# Patient Record
Sex: Male | Born: 1970 | Race: White | Hispanic: No | Marital: Married | Smoking: Former smoker
Health system: Southern US, Community
[De-identification: ages and names within clinical notes are randomized; demographics above are authoritative.]

## PROBLEM LIST (undated history)

## (undated) DIAGNOSIS — I10 Essential (primary) hypertension: Secondary | ICD-10-CM

## (undated) DIAGNOSIS — E7800 Pure hypercholesterolemia, unspecified: Secondary | ICD-10-CM

## (undated) DIAGNOSIS — E079 Disorder of thyroid, unspecified: Secondary | ICD-10-CM

## (undated) DIAGNOSIS — E119 Type 2 diabetes mellitus without complications: Secondary | ICD-10-CM

## (undated) HISTORY — PX: SHOULDER SURGERY: SHX246

## (undated) HISTORY — PX: FRACTURE SURGERY: SHX138

## (undated) HISTORY — PX: ANKLE SURGERY: SHX546

## (undated) HISTORY — PX: ELBOW SURGERY: SHX618

---

## 2004-12-25 ENCOUNTER — Inpatient Hospital Stay: Payer: Self-pay | Admitting: Unknown Physician Specialty

## 2006-03-29 ENCOUNTER — Ambulatory Visit: Payer: Self-pay | Admitting: Family Medicine

## 2006-08-04 ENCOUNTER — Ambulatory Visit: Payer: Self-pay | Admitting: Podiatry

## 2006-08-18 ENCOUNTER — Ambulatory Visit: Payer: Self-pay | Admitting: Podiatry

## 2006-08-31 ENCOUNTER — Ambulatory Visit: Payer: Self-pay | Admitting: Podiatry

## 2006-10-27 ENCOUNTER — Encounter: Payer: Self-pay | Admitting: Podiatry

## 2006-11-21 ENCOUNTER — Encounter: Payer: Self-pay | Admitting: Podiatry

## 2006-12-21 ENCOUNTER — Encounter: Payer: Self-pay | Admitting: Podiatry

## 2007-05-15 ENCOUNTER — Ambulatory Visit: Payer: Self-pay | Admitting: Family Medicine

## 2012-02-26 ENCOUNTER — Ambulatory Visit: Payer: Self-pay | Admitting: Medical

## 2012-03-03 ENCOUNTER — Ambulatory Visit: Payer: Self-pay | Admitting: Internal Medicine

## 2012-06-01 ENCOUNTER — Ambulatory Visit: Payer: Self-pay | Admitting: Unknown Physician Specialty

## 2013-11-08 ENCOUNTER — Ambulatory Visit: Payer: Self-pay | Admitting: Family Medicine

## 2013-11-22 ENCOUNTER — Ambulatory Visit: Payer: Self-pay | Admitting: Unknown Physician Specialty

## 2013-12-31 ENCOUNTER — Ambulatory Visit: Payer: Self-pay | Admitting: Unknown Physician Specialty

## 2013-12-31 LAB — HEMOGLOBIN: HGB: 17.4 g/dL (ref 13.0–18.0)

## 2013-12-31 LAB — BASIC METABOLIC PANEL
Anion Gap: 5 — ABNORMAL LOW (ref 7–16)
BUN: 8 mg/dL (ref 7–18)
CHLORIDE: 100 mmol/L (ref 98–107)
Calcium, Total: 9.1 mg/dL (ref 8.5–10.1)
Co2: 33 mmol/L — ABNORMAL HIGH (ref 21–32)
Creatinine: 0.99 mg/dL (ref 0.60–1.30)
EGFR (Non-African Amer.): >60
Glucose: 114 mg/dL — ABNORMAL HIGH (ref 65–99)
Osmolality: 275 (ref 275–301)
POTASSIUM: 3.5 mmol/L (ref 3.5–5.1)
Sodium: 138 mmol/L (ref 136–145)

## 2014-01-04 ENCOUNTER — Ambulatory Visit: Payer: Self-pay | Admitting: Unknown Physician Specialty

## 2014-07-13 NOTE — Op Note (Signed)
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   PROCEDURE: Arthroscopic subacromial decompression along with arthroscopic release of long head of the biceps tendon followed by arthroscopic acromioclavicular joint resection and microfracture of intra-articular chondral lesion.   SURGEON: Gabrielle Dare., M.D.   ANESTHESIA: General plus interscalene block on the right shoulder.   HISTORY: The patient had a long history of right shoulder pain. He had had his right subacromial space injected with steroid and anesthetic, but this did not relieve his symptoms. MRI of the shoulder revealed some degenerative changes with rotator cuff tendinosis and possible cuff tear.   The patient was ultimately brought in for surgery because of his persistent symptoms.   DESCRIPTION OF PROCEDURE: The patient had an interscalene block on his right shoulder in the preoperative area and then was taken to the operating room where satisfactory general anesthesia was achieved. The patient was turned to the lateral decubitus position with the right shoulder up. The right shoulder was prepped and draped in the usual fashion for an arthroscopic procedure. The patient incidentally was given 2 grams of Kefzol IV prior to the start of the procedure.   A scope was introduced through a posterior puncture wound into the glenohumeral joint. The joint was distended with lactated Ringers. Inspection of the joint revealed the patient had a small grade 4 chondral lesion on his humeral head and in the posteroinferior aspect of his glenoid. He had tremendous fraying of  his labrum and some fraying of his long head of the biceps tendon. There was a little fraying of the intra-articular side of the cuff near the supraspinatus attachment, but no significant tear was appreciated.   I went ahead and established an anterior portal and released the long head of the biceps tendon from its labral attachment. I used an angled ArthroCare wand for this. I then performed a limited synovectomy in the glenohumeral joint using the ArthroCare wand and a motorized resector. I also debrided the frayed labrum with a motorized resector and ArthroCare wand. I then went ahead and abraded the chondral lesions with a large round bur and then micro-fractured the lesions with a 45 degree awl.   I then switched the scope to the subacromial space. A lateral portal was established. I introduced a shaver and ArthroCare wand to perform a bursectomy. No obvious rotator cuff tear was appreciated. The patient did have some spurring on the inferior aspect of his anterior acromion. With the scope in the lateral portal I introduced an acromionizer burr through the posterior portal and performed a subacromial decompression. I switched the scope back to the posterior portal and resected the distal clavicle using the lateral portal and a portal over the anterior aspect of the [facility] joint. Further inspection of the shoulder failed to reveal any obvious cuff tear.   The instruments were removed from the shoulder joint. There were 4 puncture wounds, which I closed with 3-0 nylon in vertical mattress fashion. Betadine was applied followed by a sterile dressing and a sling.   The patient was awakened and transferred to his stretcher bed and awakened and taken to the recovery room in satisfactory condition. Blood loss was negligible. ____________________________  Alda BertholdHarold B. Jadamarie Butson Jr., MD hbk:sb D: 01/04/2014 16:09:35 ET T: 01/04/2014 17:11:23 ET JOB#: [id number]  cc: Alda BertholdHarold B. Jeron Grahn Jr., MD, <Dictator> Randon GoldsmithHAROLD B  Ilhan Madan, Montez HagemanJR MD ELECTRONICALLY SIGNED 01/13/2014 15:30

## 2016-07-05 IMAGING — MR MRI OF THE RIGHT SHOULDER WITHOUT CONTRAST
5 series · 40 of 40 positions shown · non-contrast
Comparison: 06/01/2012

CLINICAL DATA: Right shoulder pain for 5-6 years. Sports: Baseball.
Reduced range of motion.

EXAM:
MRI OF THE RIGHT SHOULDER WITHOUT CONTRAST
TECHNIQUE: Multiplanar, multisequence MR imaging of the shoulder was performed.
No intravenous contrast was administered.

[Series 4: T2 fat-sat · axial · 4.0mm · 0.62mm/px · z∈[-28,+60]mm · 8 of 21 slices shown (1 of 2)]
[im 1/21]
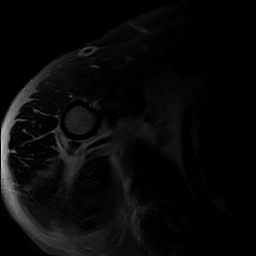
[im 3/21]
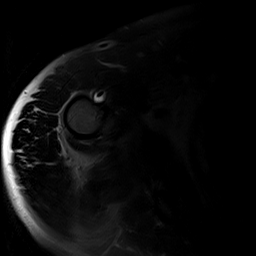
[im 6/21]
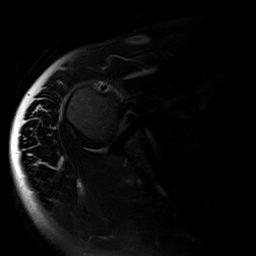
[im 9/21]
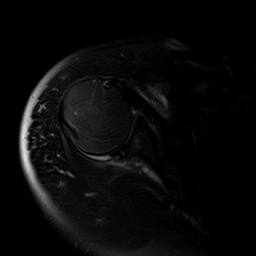
[im 12/21]
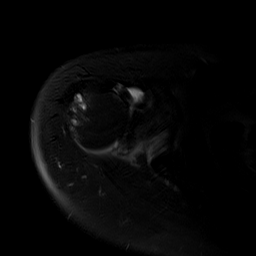
[im 15/21]
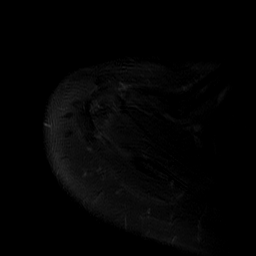
[im 18/21]
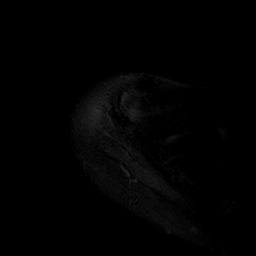
[im 21/21]
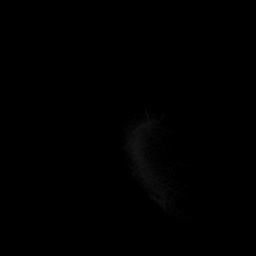

[Series 6: T2 · oblique · 4.0mm · 0.62mm/px · 8 of 19 slices shown]
[im 1/19]
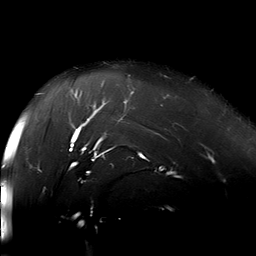
[im 3/19]
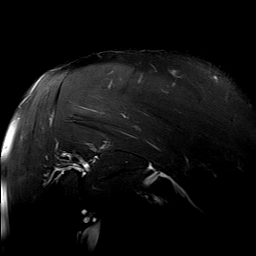
[im 6/19]
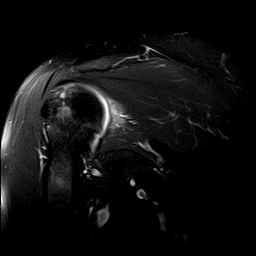
[im 8/19]
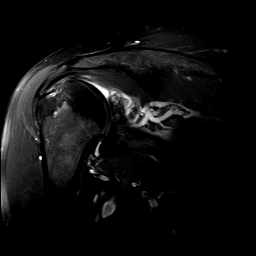
[im 11/19]
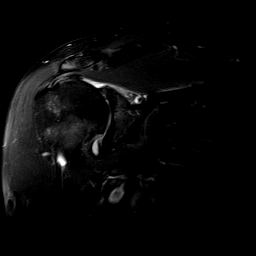
[im 13/19]
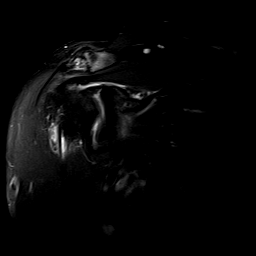
[im 16/19]
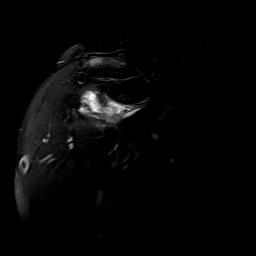
[im 19/19]
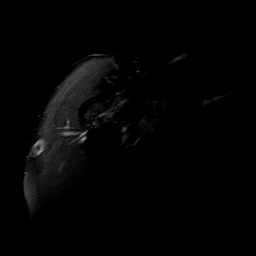

[Series 8: coronal obl pd_fil_1 · oblique · 4.0mm · 0.62mm/px · 8 of 19 slices shown]
[im 1/19]
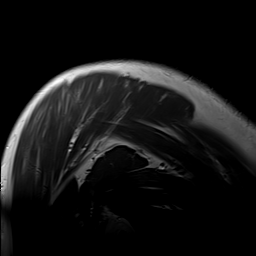
[im 3/19]
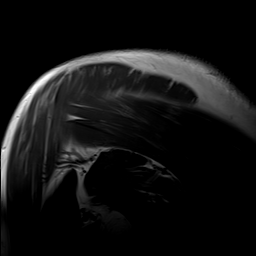
[im 6/19]
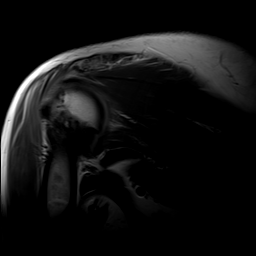
[im 8/19]
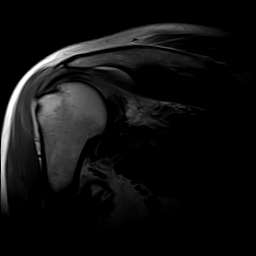
[im 11/19]
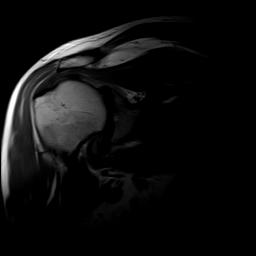
[im 13/19]
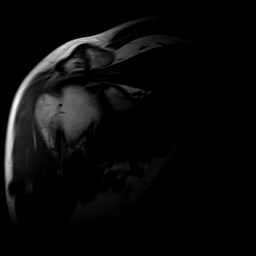
[im 16/19]
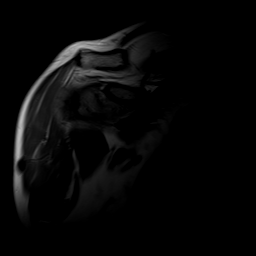
[im 19/19]
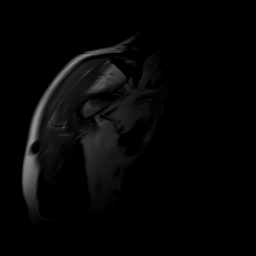

[Series 9: T1 · sagittal · 4.0mm · 0.62mm/px · 8 of 21 slices shown]
[im 1/21]
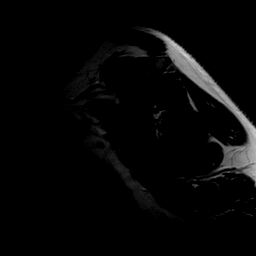
[im 3/21]
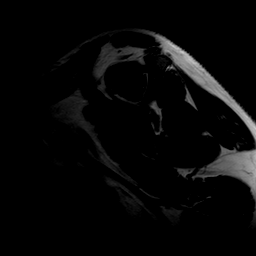
[im 6/21]
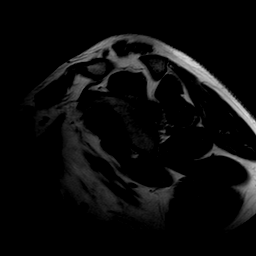
[im 9/21]
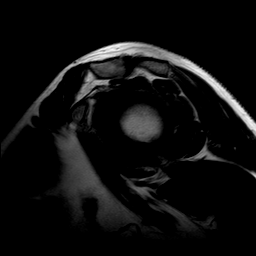
[im 12/21]
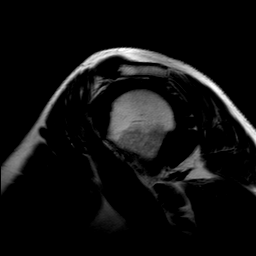
[im 15/21]
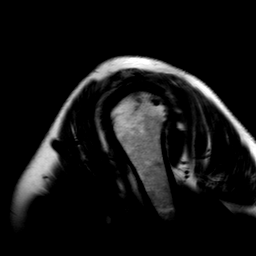
[im 18/21]
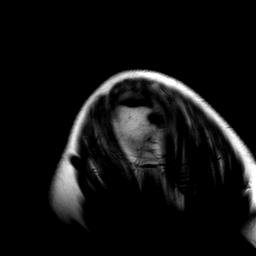
[im 21/21]
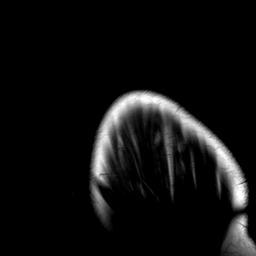

[Series 12: T2 fat-sat · sagittal · 4.0mm · 0.62mm/px · 8 of 21 slices shown (2 of 2)]
[im 1/21]
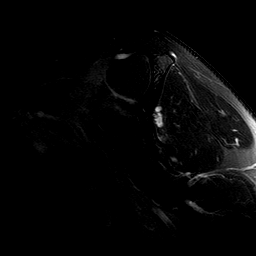
[im 3/21]
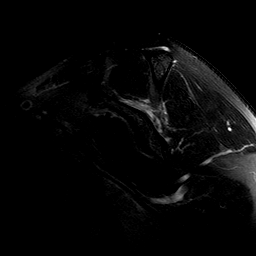
[im 6/21]
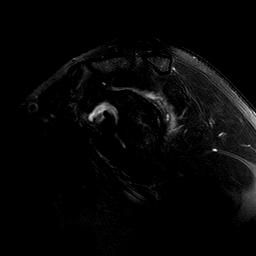
[im 9/21]
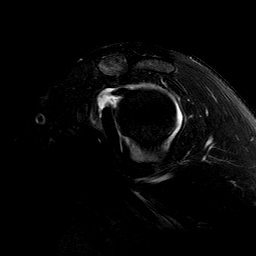
[im 12/21]
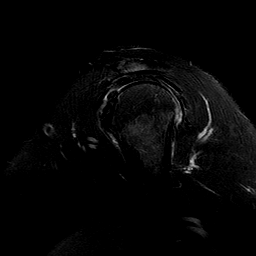
[im 15/21]
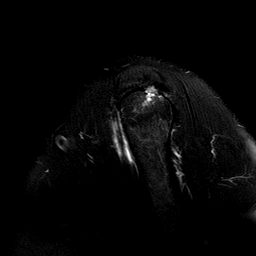
[im 18/21]
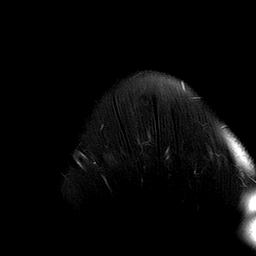
[im 21/21]
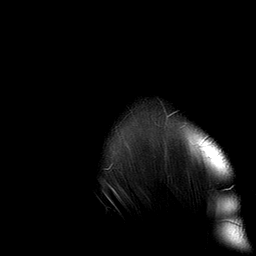

[40 of 40 positions shown; findings below may reference images not displayed]

FINDINGS: Despite efforts by the technologist and patient, motion artifact is
present on today's exam and could not be eliminated. This reduces
exam sensitivity and specificity.

Rotator cuff: Considerable distal partial thickness articular
surface tearing of the infraspinatus and supraspinatus tendons
observed, images 6 through 11 of series 6. No definite
full-thickness rotator cuff tear identified. There is associated
mild supraspinatus and infraspinatus tendinopathy and minimal
subscapularis tendinopathy.

Muscles:  Intact

Biceps long head:  Mild tendinopathy of the intra-articular segment.

Acromioclavicular Joint: Mild to moderate degenerative arthropathy
with subcortical edema and mild inferior spurring. Subacromial
morphology is type 2 (curved).

Glenohumeral Joint: Mild shoulder joint effusion. Considerable
thinning of articular cartilage along the humerus and especially the
glenoid, with considerable degenerative subcortical edema and some
flattening of the posterior glenoid which appears degenerative. Mild
glenoid and humeral head spurring.

Labrum:  Grossly intact

Bones: Degenerative subcortical cystic lesions noted in the
posterior in central portions of the anatomic neck laterally.
IMPRESSION: 1. Partial thickness articular surface tearing along significant
portions of the distal infraspinatus and supraspinatus tendons,
without full-thickness tear identified. No significant abnormal
fluid in the subacromial subdeltoid bursa.
2. Mild supraspinatus scratch that mild rotator cuff and biceps
tendinopathy.
3. Small shoulder joint effusion.
4. Degenerative glenohumeral arthropathy is most severe posteriorly
on the glenoid with air some flattening and a confluence of small
degenerative subcortical lesions. Mild humeral head and glenoid
spurring.
5. Mild to moderate degenerative AC joint arthropathy.

## 2019-05-18 ENCOUNTER — Ambulatory Visit (INDEPENDENT_AMBULATORY_CARE_PROVIDER_SITE_OTHER): Payer: BC Managed Care – PPO

## 2019-05-18 ENCOUNTER — Ambulatory Visit
Admission: EM | Admit: 2019-05-18 | Discharge: 2019-05-18 | Disposition: A | Discharge status: Home / Self Care | Payer: BC Managed Care – PPO | Attending: Family Medicine | Admitting: Family Medicine

## 2019-05-18 ENCOUNTER — Other Ambulatory Visit: Payer: Self-pay

## 2019-05-18 ENCOUNTER — Encounter: Payer: Self-pay | Admitting: Emergency Medicine

## 2019-05-18 DIAGNOSIS — R000 Tachycardia, unspecified: Secondary | ICD-10-CM | POA: Diagnosis present

## 2019-05-18 DIAGNOSIS — M25572 Pain in left ankle and joints of left foot: Secondary | ICD-10-CM | POA: Diagnosis not present

## 2019-05-18 DIAGNOSIS — I959 Hypotension, unspecified: Secondary | ICD-10-CM | POA: Diagnosis present

## 2019-05-18 DIAGNOSIS — S93402A Sprain of unspecified ligament of left ankle, initial encounter: Secondary | ICD-10-CM | POA: Insufficient documentation

## 2019-05-18 DIAGNOSIS — S86002A Unspecified injury of left Achilles tendon, initial encounter: Secondary | ICD-10-CM | POA: Diagnosis present

## 2019-05-18 DIAGNOSIS — E876 Hypokalemia: Secondary | ICD-10-CM | POA: Diagnosis present

## 2019-05-18 DIAGNOSIS — M79672 Pain in left foot: Secondary | ICD-10-CM

## 2019-05-18 DIAGNOSIS — I9589 Other hypotension: Secondary | ICD-10-CM

## 2019-05-18 HISTORY — DX: Type 2 diabetes mellitus without complications: E11.9

## 2019-05-18 HISTORY — DX: Pure hypercholesterolemia, unspecified: E78.00

## 2019-05-18 HISTORY — DX: Essential (primary) hypertension: I10

## 2019-05-18 HISTORY — DX: Disorder of thyroid, unspecified: E07.9

## 2019-05-18 LAB — BASIC METABOLIC PANEL
Anion gap: UNDETERMINED (ref 5–15)
BUN: 23 mg/dL — ABNORMAL HIGH (ref 6–20)
CO2: 21 mmol/L — ABNORMAL LOW (ref 22–32)
Calcium: 8.9 mg/dL (ref 8.9–10.3)
Chloride: 84 mmol/L — ABNORMAL LOW (ref 98–111)
Creatinine, Ser: 1.38 mg/dL — ABNORMAL HIGH (ref 0.61–1.24)
GFR calc Af Amer: >60 mL/min (ref >60)
GFR calc non Af Amer: 59 mL/min — ABNORMAL LOW (ref >60)
Glucose, Bld: 159 mg/dL — ABNORMAL HIGH (ref 70–99)
Potassium: 3.3 mmol/L — ABNORMAL LOW (ref 3.5–5.1)
Sodium: UNDETERMINED mmol/L (ref 135–145)

## 2019-05-18 MED ORDER — SODIUM CHLORIDE 0.9 % IV BOLUS
1000.0000 mL | Freq: Once | INTRAVENOUS | Status: AC
  Administered 2019-05-18: 1000 mL via INTRAVENOUS

## 2019-05-18 MED ORDER — KETOROLAC TROMETHAMINE 60 MG/2ML IM SOLN
60.0000 mg | Freq: Once | INTRAMUSCULAR | Status: AC
  Administered 2019-05-18: 60 mg via INTRAMUSCULAR

## 2019-05-18 MED ORDER — POTASSIUM CHLORIDE CRYS ER 20 MEQ PO TBCR
20.0000 meq | EXTENDED_RELEASE_TABLET | Freq: Once | ORAL | Status: AC
  Administered 2019-05-18: 20 meq via ORAL

## 2019-05-18 MED ORDER — NAPROXEN 500 MG PO TABS: 500.0000 mg | ORAL_TABLET | Freq: Two times a day (BID) | ORAL | 0 refills | Status: AC

## 2019-05-18 NOTE — ED Provider Notes (Signed)
[facility]    CSN: [id number] Arrival date & time: 05/18/19  1549      History   Chief Complaint Chief Complaint  Patient presents with  . Ankle Pain    left  . Foot Pain    left    HPI Dakota Nunez is a 49 y.o. male.   Dakota Nunez presents with complaints of left ankle and foot pain. Yesterday evening was helping push his daughters jeep up a hill and he slipped/ mistepped with his left foot and it twisted and he fell. Heard a pop. Ambulatory after this but has since developed pain which has worsened. Numbness sensation to lower leg. No bruising or swelling. No redness. Took tylenol which didn't necessarily help. No previous injury to left foot or ankle. Pain 9/10.    He does take lisinopril-hctz for his htn. History of alcohol abuse, states he has cut way back and instead of drinking daily he has been drinking maybe only twice a week. Difficulty obtaining BP initially, and with exam patient states he feels lightheaded due to pain. No further light headedness, chest pain , or palpitations. He does follow with a PCP, last seen last June.     ROS per HPI, negative if not otherwise mentioned.      Past Medical History:  Diagnosis Date  . Diabetes mellitus without complication ([place])   . Hypercholesterolemia   . Hypertension   . Thyroid disease     There are no problems to display for this patient.   Past Surgical History:  Procedure Laterality Date  . ANKLE SURGERY Right   . ELBOW SURGERY Left   . FRACTURE SURGERY    . SHOULDER SURGERY Right        Home Medications    Prior to Admission medications   Medication Sig Start Date End Date Taking? Authorizing Provider  atorvastatin (LIPITOR) 10 MG tablet Take by mouth. 05/25/17  Yes [provider]  budesonide-formoterol (SYMBICORT) 80-4.5 MCG/ACT inhaler Inhale into the lungs. 05/25/17  Yes [provider]  lisinopril (ZESTRIL) 20 MG tablet Take by mouth. 05/28/18  Yes [provider]  magnesium oxide (MAG-OX) 400 MG tablet Take by mouth. 05/28/18 05/28/19 Yes [provider]  metFORMIN (GLUCOPHAGE) 850 MG tablet Take by mouth. 05/25/17  Yes [provider]  Multiple Vitamins-Minerals (MULTIVITAMIN WITH MINERALS) tablet Take by mouth. 05/28/18 05/28/19 Yes [provider]  Omega-3 Fatty Acids (FISH OIL) 1000 MG CAPS Take by mouth.   Yes [provider]  omeprazole (PRILOSEC) 40 MG capsule Take by mouth. 11/28/17  Yes [provider]  naproxen (NAPROSYN) 500 MG tablet Take 1 tablet (500 mg total) by mouth 2 (two) times daily. 05/18/19   Zigmund Gottron, NP    Family History Family History  Problem Relation Age of Onset  . Cancer Mother   . Emphysema Father     Social History Social History   Tobacco Use  . Smoking status: Former Research scientist (life sciences)  . Smokeless tobacco: Never Used  Substance Use Topics  . Alcohol use: Yes  . Drug use: Never     Allergies   Patient has no known allergies.   Review of Systems Review of Systems   Physical Exam Triage Vital Signs ED Triage Vitals  Enc Vitals Group     BP 05/18/19 1614 92/62     Pulse Rate 05/18/19 1614 (!) 126     Resp 05/18/19 1614 16     Temp 05/18/19  1614 98.3 F (36.8 C)     Temp Source 05/18/19 1614 Oral     SpO2 05/18/19 1614 99 %     Weight 05/18/19 1608 140 lb (63.5 kg)     Height 05/18/19 1608 5\' 7"  (1.702 m)     Head Circumference --      Peak Flow --      Pain Score 05/18/19 1608 9     Pain Loc --      Pain Edu? --      Excl. in GC? --    No data found.  Updated Vital Signs BP 116/70 (BP Location: Right Arm)   Pulse 100   Temp 98.3 F (36.8 C) (Oral)   Resp 16   Ht 5\' 7"  (1.702 m)   Wt 140 lb (63.5 kg)   SpO2 95%   BMI 21.93 kg/m    Physical Exam Constitutional:      Appearance: He is well-developed.  Cardiovascular:     Rate and Rhythm: Tachycardia present.  Pulmonary:     Effort: Pulmonary effort is normal.  Musculoskeletal:      Left ankle: No swelling, deformity, ecchymosis or lacerations. Tenderness present. Decreased range of motion.     Left Achilles Tendon: Tenderness present. Thompson's test negative.     Comments: Patient with significant tenderness about ankle without specific bony tenderness; strong palpation pulse; cap refill < 2 seconds ; unable to dorsiflex ankle; moving toes without difficulty; no knee or proximal tibia pain; states decreased sensation but sensation intact; foot is warm; pain with thompson's test and some weakness noted but still with response present  Skin:    General: Skin is warm and dry.  Neurological:     Mental Status: He is alert and oriented to person, place, and time.     EKG:  Sinus tach 117 . Previous EKG was not available for review. No stwave changes as interpreted by me.    UC Treatments / Results  Labs (all labs ordered are listed, but only abnormal results are displayed) Labs Reviewed  BASIC METABOLIC PANEL - Abnormal; Notable for the following components:      Result Value   Potassium 3.3 (*)    Chloride 84 (*)    CO2 21 (*)    Glucose, Bld 159 (*)    BUN 23 (*)    Creatinine, Ser 1.38 (*)    GFR calc non Af Amer 59 (*)    All other components within normal limits    EKG   Radiology DG Ankle Complete Left  Result Date: 05/18/2019 CLINICAL DATA:  Twisting injury, pain EXAM: LEFT ANKLE COMPLETE - 3+ VIEW; LEFT FOOT - COMPLETE 3+ VIEW COMPARISON:  None. FINDINGS: No fracture or dislocation of the left foot or ankle. Joint spaces are well preserved. Soft tissues are unremarkable. IMPRESSION: No fracture or dislocation of the left foot or ankle. Joint spaces are well preserved. Electronically Signed   By: M.D.   On: 05/18/2019 16:47   DG Foot Complete Left  Result Date: 05/18/2019 CLINICAL DATA:  Twisting injury, pain EXAM: LEFT ANKLE COMPLETE - 3+ VIEW; LEFT FOOT - COMPLETE 3+ VIEW COMPARISON:  None. FINDINGS: No fracture or dislocation of the  left foot or ankle. Joint spaces are well preserved. Soft tissues are unremarkable. IMPRESSION: No fracture or dislocation of the left foot or ankle. Joint spaces are well preserved. Electronically Signed   By: 05/20/2019 M.D.   On: 05/18/2019 16:47  Procedures Procedures (including critical care time)  Medications Ordered in UC Medications  ketorolac (TORADOL) injection 60 mg (60 mg Intramuscular Given 05/18/19 1716)  sodium chloride 0.9 % bolus 1,000 mL (0 mLs Intravenous Stopped 05/18/19 1826)  potassium chloride SA (KLOR-CON) CR tablet 20 mEq (20 mEq Oral Given 05/18/19 1846)    Initial Impression / Assessment and Plan / UC Course  I have reviewed the triage vital signs and the nursing notes.  Pertinent labs & imaging results that were available during my care of the patient were reviewed by me and considered in my medical decision making (see chart for details).     xrays normal. Patient with significant pain to ankle and achilles with limited rom of ankle. No bruising or swelling noted, however. Pain improved with toradol here in clinic. Cam walker and ace wrap applied. Follow up with orthopedics for recheck.   Patient initially hypotensive, unable to obtain an auto BP initially with HR up into the 130's and 140's, manual BP in the 90's. Down to the 70's. Opted to initiate liter of IVF with noted changes in labs to iclude elevated creatine and decreased bun and gfr. Unable to obtain sodium. Mild hyponatremia, K oral provided here before depart. Encouraged to stop his BP m edication and close follow up next week for bp and lab recheck with pcp. Return precautions provided. Patient verbalized understanding and agreeable to plan.   Final Clinical Impressions(s) / UC Diagnoses   Final diagnoses:  Achilles tendon injury, left, initial encounter  Sprain of left ankle, unspecified ligament, initial encounter  Hypotension, unspecified hypotension type  Tachycardia  Hypokalemia      Discharge Instructions     Your xrays are normal tonight which is reassuring. Your exam is consistent with ankle sprain and concern for some injury to your achilles as well. Use of the boot we have provided as well as crutches as needed. Ice, elevation to help with pain. Naproxen twice a day, may take additional tylenol as needed as well, but don't take any further advil or ibuprofen.  Please call on Monday to set up with your orthopedist for recheck in the next few weeks.  I would like you to hold off on your bp medication. Please call on Monday to follow up with your PCP for recheck of your BP and your labs in the next week.  If feeling dizzy, headache, light headed, palpitations, or otherwise worsening please go to the ER.     ED Prescriptions    Medication Sig Dispense Auth. Provider   naproxen (NAPROSYN) 500 MG tablet Take 1 tablet (500 mg total) by mouth 2 (two) times daily. 30 tablet Georgetta Haber, NP     PDMP not reviewed this encounter.   Georgetta Haber, NP 05/18/19 [phone]

## 2019-05-18 NOTE — ED Triage Notes (Signed)
Patient states that he was pushing his daughter's jeep up a hill and he states that he slipped on some leaves yesterday.  Patient states that his left ankle rolled up under his left leg.  Patient c/o pain and swelling in his left ankle and foot.  Patient states that he took Tylenol around 12pm today.

## 2019-05-18 NOTE — Discharge Instructions (Addendum)
Your xrays are normal tonight which is reassuring. Your exam is consistent with ankle sprain and concern for some injury to your achilles as well. Use of the boot we have provided as well as crutches as needed. Ice, elevation to help with pain. Naproxen twice a day, may take additional tylenol as needed as well, but don't take any further advil or ibuprofen.  Please call on Monday to set up with your orthopedist for recheck in the next few weeks.  I would like you to hold off on your bp medication. Please call on Monday to follow up with your PCP for recheck of your BP and your labs in the next week.  If feeling dizzy, headache, light headed, palpitations, or otherwise worsening please go to the ER.

## 2019-05-30 ENCOUNTER — Other Ambulatory Visit: Payer: Self-pay | Admitting: Family Medicine

## 2019-05-30 DIAGNOSIS — R748 Abnormal levels of other serum enzymes: Secondary | ICD-10-CM

## 2019-06-04 ENCOUNTER — Ambulatory Visit: Payer: BC Managed Care – PPO

## 2019-06-11 ENCOUNTER — Ambulatory Visit: Admission: RE | Admit: 2019-06-11 | Payer: BC Managed Care – PPO | Source: Ambulatory Visit

## 2019-06-18 ENCOUNTER — Ambulatory Visit
Admission: RE | Admit: 2019-06-18 | Discharge: 2019-06-18 | Disposition: A | Discharge status: Home / Self Care | Payer: BC Managed Care – PPO | Source: Ambulatory Visit | Attending: Family Medicine | Admitting: Family Medicine

## 2019-06-18 ENCOUNTER — Other Ambulatory Visit: Payer: Self-pay

## 2019-06-18 DIAGNOSIS — R748 Abnormal levels of other serum enzymes: Secondary | ICD-10-CM | POA: Diagnosis not present

## 2019-12-11 ENCOUNTER — Ambulatory Visit (INDEPENDENT_AMBULATORY_CARE_PROVIDER_SITE_OTHER): Payer: BC Managed Care – PPO

## 2019-12-11 ENCOUNTER — Other Ambulatory Visit: Payer: Self-pay

## 2019-12-11 ENCOUNTER — Ambulatory Visit
Admission: EM | Admit: 2019-12-11 | Discharge: 2019-12-11 | Disposition: A | Discharge status: Home / Self Care | Payer: BC Managed Care – PPO | Attending: Emergency Medicine | Admitting: Emergency Medicine

## 2019-12-11 DIAGNOSIS — S298XXA Other specified injuries of thorax, initial encounter: Secondary | ICD-10-CM | POA: Diagnosis not present

## 2019-12-11 DIAGNOSIS — S46911A Strain of unspecified muscle, fascia and tendon at shoulder and upper arm level, right arm, initial encounter: Secondary | ICD-10-CM

## 2019-12-11 DIAGNOSIS — M25511 Pain in right shoulder: Secondary | ICD-10-CM | POA: Diagnosis not present

## 2019-12-11 MED ORDER — ALBUTEROL SULFATE HFA 108 (90 BASE) MCG/ACT IN AERS
2.0000 | INHALATION_SPRAY | RESPIRATORY_TRACT | Status: DC | PRN
  Administered 2019-12-11 (×2): 2 via RESPIRATORY_TRACT

## 2019-12-11 NOTE — ED Provider Notes (Signed)
[facility]    CSN: [id number] Arrival date & time: 12/11/19  1602      History   Chief Complaint Chief Complaint  Patient presents with   Shoulder Pain    right    HPI Dakota Nunez is a 49 y.o. male.   49 yo male here for evaluation of right shoulder pain.  He reports that he was reaching up to pul down an overhead bun full of cable at work last night and felt a pop in his right shoulder.  He has had rotator cuff surgery on that shoulder 7-8 years ago.  HE says that he felt a second pop when he tried to raise his arm up over his head. Denies numbness, tingling, or weakness.      Past Medical History:  Diagnosis Date   Diabetes mellitus without complication (HCC)    Hypercholesterolemia    Hypertension    Thyroid disease     There are no problems to display for this patient.   Past Surgical History:  Procedure Laterality Date   ANKLE SURGERY Right    ELBOW SURGERY Left    FRACTURE SURGERY     SHOULDER SURGERY Right        Home Medications    Prior to Admission medications   Medication Sig Start Date End Date Taking? Authorizing Provider  atorvastatin (LIPITOR) 10 MG tablet Take by mouth. 05/25/17  Yes [provider]  budesonide-formoterol (SYMBICORT) 80-4.5 MCG/ACT inhaler Inhale into the lungs. 05/25/17  Yes [provider]  lisinopril (ZESTRIL) 20 MG tablet Take by mouth. 05/28/18  Yes [provider]  metFORMIN (GLUCOPHAGE) 850 MG tablet Take by mouth. 05/25/17  Yes [provider]  naproxen (NAPROSYN) 500 MG tablet Take 1 tablet (500 mg total) by mouth 2 (two) times daily. 05/18/19  Yes Burky, Barron Alvine, NP  Omega-3 Fatty Acids (FISH OIL) 1000 MG CAPS Take by mouth.   Yes [provider]  omeprazole (PRILOSEC) 40 MG capsule Take by mouth. 11/28/17  Yes [provider]    Family History Family History  Problem Relation Age of Onset   Cancer Mother    Emphysema Father      Social History Social History   Tobacco Use   Smoking status: Former Smoker   Smokeless tobacco: Never Used  Building services engineer Use: Never used  Substance Use Topics   Alcohol use: Yes   Drug use: Never     Allergies   Patient has no known allergies.   Review of Systems Review of Systems  Constitutional: Negative for activity change, appetite change and fever.  HENT: Negative for congestion and rhinorrhea.   Respiratory: Negative for cough, shortness of breath and wheezing.   Cardiovascular: Negative for chest pain.  Musculoskeletal: Positive for arthralgias. Negative for back pain, myalgias, neck pain and neck stiffness.  Skin: Negative.   Neurological: Negative for weakness and numbness.  Hematological: Negative.   Psychiatric/Behavioral: Negative.      Physical Exam Triage Vital Signs ED Triage Vitals  Enc Vitals Group     BP 12/11/19 1636 (!) 155/102     Pulse Rate 12/11/19 1636 (!) 115     Resp 12/11/19 1636 18     Temp 12/11/19 1636 98.2 F (36.8 C)     Temp Source 12/11/19 1636 Oral     SpO2 12/11/19 1636 90 %     Weight 12/11/19 1634 138 lb (62.6 kg)     Height 12/11/19  1634 5\' 6"  (1.676 m)     Head Circumference --      Peak Flow --      Pain Score 12/11/19 1634 8     Pain Loc --      Pain Edu? --      Excl. in GC? --    No data found.  Updated Vital Signs BP (!) 155/102 (BP Location: Left Arm)    Pulse (!) 115    Temp 98.2 F (36.8 C) (Oral)    Resp 18    Ht 5\' 6"  (1.676 m)    Wt 138 lb (62.6 kg)    SpO2 90%    BMI 22.27 kg/m   Visual Acuity Right Eye Distance:   Left Eye Distance:   Bilateral Distance:    Right Eye Near:   Left Eye Near:    Bilateral Near:     Physical Exam Vitals and nursing note reviewed.  Constitutional:      General: He is in acute distress.     Appearance: Normal appearance. He is normal weight.  HENT:     Head: Normocephalic and atraumatic.  Cardiovascular:     Rate and Rhythm: Normal rate and  regular rhythm.     Pulses: Normal pulses.     Heart sounds: Normal heart sounds. No murmur heard.   Pulmonary:     Effort: Pulmonary effort is normal.     Breath sounds: Wheezing present.     Comments: patient has wheezes in all lung fields Musculoskeletal:        General: Tenderness and signs of injury present. No swelling or deformity.     Right shoulder: Tenderness and crepitus present. No swelling, deformity, effusion or bony tenderness. Decreased range of motion. Normal strength. Normal pulse.     Cervical back: Normal range of motion and neck supple.     Comments: On exam patient has reduced active ROM of his right arm. With passive ROM 90 degree of extension can be achieved, 30 degrees external rotation, 10 degrees internal rotation, and 60 degrees of abduction. Radial and ulnar pulses are 2+. Grips 5/5. When attempting to push or pull the patient cannot due to pain.   There is crepitus with ROM in the glenohumeral joint and tenderness to the posterior aspect of the joint capsule with palpation.   Skin:    General: Skin is warm.  Neurological:     General: No focal deficit present.     Mental Status: He is alert and oriented to person, place, and time.  Psychiatric:        Mood and Affect: Mood normal.        Thought Content: Thought content normal.        Judgment: Judgment normal.      UC Treatments / Results  Labs (all labs ordered are listed, but only abnormal results are displayed) Labs Reviewed - No data to display  EKG   Radiology DG Chest 2 View  Result Date: 12/11/2019 CLINICAL DATA:  Right shoulder pain after injury last night. EXAM: CHEST - 2 VIEW COMPARISON:  None. FINDINGS: The heart size and mediastinal contours are within normal limits. Both lungs are clear. No pneumothorax or pleural effusion is noted. The visualized skeletal structures are unremarkable. IMPRESSION: No active cardiopulmonary disease. Electronically Signed   By: M.D.   On:  12/11/2019 17:52   DG Shoulder Right  Result Date: 12/11/2019 CLINICAL DATA:  Acute right shoulder pain after injury  last night. EXAM: RIGHT SHOULDER - 2+ VIEW COMPARISON:  June 01, 2012. FINDINGS: There is no evidence of fracture or dislocation. Mild osteophyte formation is seen involving the right humeral head. No significant joint space narrowing is noted. Soft tissues are unremarkable. 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 No acute abnormality is noted. Electronically Signed   By: Lupita Raider M.D.   On: 12/11/2019 17:53    Procedures Procedures (including critical care time)  Medications Ordered in UC Medications  albuterol (VENTOLIN HFA) 108 (90 Base) MCG/ACT inhaler 2 puff (2 puffs Inhalation Given 12/11/19 1841)    Initial Impression / Assessment and Plan / UC Course  I have reviewed the triage vital signs and the nursing notes.  Pertinent labs & imaging results that were available during my care of the patient were reviewed by me and considered in my medical decision making (see chart for details).   Patient is here for evaluation of right shoulder pain after trying to pull a bin down from overhead at work last night. On exam there is tenderness to the posterior aspect of the joint and also crepitus with ROM. Patient had a rotator cuff repair 7-8 years ago on the right as well.   Will obtain imaging or the shoulder.  Incidental finding of low O2 sat at triage. Patient has COPD and reports being out of his Symbicort for several months. He was notified yesterday that the pharmacy has it in and he needs to pick it up. He denies SOB or CP. He reports walking 1.5 miles yesterday without difficulty. HE still smokes 1 ppd.   Will give breathing treatment and get CXR.   Shoulder x-ray negative for fracture or dislocation.   CXR negative for hyper inflation or infiltrate.   Will D/C home with sling, rest, and home PT.  Final Clinical  Impressions(s) / UC Diagnoses   Final diagnoses:  Strain of right shoulder, initial encounter     Discharge Instructions     There is no evidence of broken bones or dislocation of your shoulder.  Wear the sling for the next 2-3 days. Apply moist heat for 20-30 minutes at a time, 2-3 times a day. Follow the shoulder exercises given at triage. If your pain does not improve I recommend seeing the orthopedist who treated your shoulder injury in the past.   You can use OTC Ibuprofen as needed for pian.    ED Prescriptions    None     PDMP not reviewed this encounter.   Becky Augusta, NP 12/11/19 1842

## 2019-12-11 NOTE — Discharge Instructions (Addendum)
There is no evidence of broken bones or dislocation of your shoulder.  Wear the sling for the next 2-3 days. Apply moist heat for 20-30 minutes at a time, 2-3 times a day. Follow the shoulder exercises given at triage. If your pain does not improve I recommend seeing the orthopedist who treated your shoulder injury in the past.   You can use OTC Ibuprofen as needed for pian.

## 2019-12-11 NOTE — ED Triage Notes (Signed)
Patient complains of right shoulder pain that occurred last night while reaching up to grab something heavy off a shelf and felt a pop. States that pain has continued, tried to stretch shoulder and felt another pop and pain worsened. States that he had a previous rotator cuff surgery on this shoulder.

## 2020-12-16 ENCOUNTER — Ambulatory Visit: Admission: RE | Admit: 2020-12-16 | Payer: BC Managed Care – PPO | Source: Home / Self Care

## 2020-12-16 ENCOUNTER — Encounter: Admission: RE | Payer: Self-pay | Source: Home / Self Care

## 2020-12-16 SURGERY — COLONOSCOPY WITH PROPOFOL
Anesthesia: General

## 2021-04-07 ENCOUNTER — Other Ambulatory Visit: Payer: Self-pay

## 2021-04-07 ENCOUNTER — Ambulatory Visit
Admission: EM | Admit: 2021-04-07 | Discharge: 2021-04-07 | Payer: BC Managed Care – PPO | Attending: Internal Medicine | Admitting: Internal Medicine

## 2021-04-07 NOTE — ED Notes (Signed)
Provider at bedside

## 2021-04-07 NOTE — ED Triage Notes (Signed)
Patient presents to Urgent Care with complaints of abdominal distention, new onset juandice, and weakness x 2 days ago. No hx of liver disease.   Denies SOB.

## 2023-09-16 ENCOUNTER — Encounter: Payer: Self-pay | Admitting: Emergency Medicine

## 2023-09-16 ENCOUNTER — Ambulatory Visit
Admission: EM | Admit: 2023-09-16 | Discharge: 2023-09-16 | Disposition: A | Discharge status: Home / Self Care | Attending: Emergency Medicine | Admitting: Emergency Medicine

## 2023-09-16 DIAGNOSIS — H1032 Unspecified acute conjunctivitis, left eye: Secondary | ICD-10-CM

## 2023-09-16 MED ORDER — MOXIFLOXACIN HCL 0.5 % OP SOLN: 1.0000 [drp] | Freq: Three times a day (TID) | OPHTHALMIC | 0 refills | Status: AC

## 2023-09-16 NOTE — ED Triage Notes (Signed)
 Pt c/o left eye itching, redness, watery, swollen, discharge, and pain. Started about 3 days ago.

## 2023-09-16 NOTE — Discharge Instructions (Addendum)
 Instill 1 drop of Vigamox in each eye every 8 hours for the next 7 days for treatment of your conjunctivitis.  Avoid touching your eyes as much as possible.  Wipe down all surfaces, countertops, and doorknobs after the first and second 24 hours on eyedrops.  Wash her face with a clean wash rag to remove any drainage and use a different portion of the wash rag to clean each eye so as to not reinfect yourself.  Return for reevaluation for any new or worsening symptoms.

## 2023-09-16 NOTE — ED Provider Notes (Signed)
 [facility]    CSN: [id number] Arrival date & time: 09/16/23  9146      History   Chief Complaint Chief Complaint  Patient presents with   Eye Problem    left    HPI Dakota Nunez is a 53 y.o. male.   HPI  53 year old male with past medical history significant for diabetes, hypercholesterolemia, hypertension, and thyroid disease presents for evaluation of 3 days worth of left eye redness, swelling, itching, light sensitivity, and mucousy discharge.  He also reports that the vision in the left eye is little bit blurry.  He denies any injury or foreign body sensation.  Patient does wear glasses.  Past Medical History:  Diagnosis Date   Diabetes mellitus without complication (HCC)    Hypercholesterolemia    Hypertension    Thyroid disease     There are no active problems to display for this patient.   Past Surgical History:  Procedure Laterality Date   ANKLE SURGERY Right    ELBOW SURGERY Left    FRACTURE SURGERY     SHOULDER SURGERY Right        Home Medications    Prior to Admission medications   Medication Sig Start Date End Date Taking? Authorizing Provider  atorvastatin (LIPITOR) 10 MG tablet Take by mouth. 05/25/17  Yes [provider]  budesonide-formoterol (SYMBICORT) 80-4.5 MCG/ACT inhaler Inhale into the lungs. 05/25/17  Yes [provider]  lisinopril (ZESTRIL) 20 MG tablet Take by mouth. 05/28/18  Yes [provider]  metFORMIN (GLUCOPHAGE) 850 MG tablet Take by mouth. 05/25/17  Yes [provider]  moxifloxacin (VIGAMOX) 0.5 % ophthalmic solution Place 1 drop into both eyes 3 (three) times daily for 7 days. 09/16/23 09/23/23 Yes Bernardino Ditch, NP  Omega-3 Fatty Acids (FISH OIL) 1000 MG CAPS Take by mouth.   Yes [provider]  omeprazole (PRILOSEC) 40 MG capsule Take by mouth. 11/28/17  Yes [provider]  naproxen  (NAPROSYN ) 500 MG tablet Take 1 tablet (500 mg total) by mouth 2 (two) times  daily. 05/18/19   Tellis Laneta NOVAK, NP    Family History Family History  Problem Relation Age of Onset   Cancer Mother    Emphysema Father     Social History Social History   Tobacco Use   Smoking status: Former   Smokeless tobacco: Never  Advertising account planner   Vaping status: Never Used  Substance Use Topics   Alcohol use: Yes   Drug use: Never     Allergies   Patient has no known allergies.   Review of Systems Review of Systems  Constitutional:  Negative for fever.  Eyes:  Positive for photophobia, pain, discharge, redness, itching and visual disturbance.     Physical Exam Triage Vital Signs ED Triage Vitals [09/16/23 0907]  Encounter Vitals Group     BP      Girls Systolic BP Percentile      Girls Diastolic BP Percentile      Boys Systolic BP Percentile      Boys Diastolic BP Percentile      Pulse      Resp      Temp      Temp src      SpO2      Weight 138 lb 0.1 oz (62.6 kg)     Height 5' 6 (1.676 m)     Head Circumference      Peak Flow      Pain Score 6  Pain Loc      Pain Education      Exclude from Growth Chart    No data found.  Updated Vital Signs BP (!) 138/90 (BP Location: Left Arm)   Pulse 78   Temp 98 F (36.7 C) (Oral)   Resp 16   Ht 5' 6 (1.676 m)   Wt 138 lb 0.1 oz (62.6 kg)   SpO2 95%   BMI 22.28 kg/m   Visual Acuity Right Eye Distance: 20/20 corrected Left Eye Distance: 20/40 corrected Bilateral Distance: 20/20 corrected  Right Eye Near:   Left Eye Near:    Bilateral Near:     Physical Exam Vitals and nursing note reviewed.  Constitutional:      Appearance: Normal appearance. He is not ill-appearing.  HENT:     Head: Normocephalic and atraumatic.   Eyes:     General: No scleral icterus.       Right eye: No discharge.        Left eye: Discharge present.    Extraocular Movements: Extraocular movements intact.     Pupils: Pupils are equal, round, and reactive to light.     Comments: Normal red reflex in the left  eye and pupils equal round reactive.  EOMs intact.  Patient does have photophobia.   Skin:    General: Skin is warm and dry.     Capillary Refill: Capillary refill takes less than 2 seconds.     Findings: No rash.   Neurological:     General: No focal deficit present.     Mental Status: He is alert and oriented to person, place, and time.      UC Treatments / Results  Labs (all labs ordered are listed, but only abnormal results are displayed) Labs Reviewed - No data to display  EKG   Radiology No results found.  Procedures Procedures (including critical care time)  Medications Ordered in UC Medications - No data to display  Initial Impression / Assessment and Plan / UC Course  I have reviewed the triage vital signs and the nursing notes.  Pertinent labs & imaging results that were available during my care of the patient were reviewed by me and considered in my medical decision making (see chart for details).   Patient is a nontoxic-appearing 53 year old male presenting for evaluation of left eye complaints as outlined in HPI above.  As you can see in image above, the lateral aspect of the eye is erythematous and injected.  There is also erythema and injection of the labral conjunctiva of the lower eyelid.  Mucopurulent discharge is dried in the lashes and on the lower eyelid.  He has a picture on his phone which demonstrates significant mucopurulent discharge from the lateral aspect of the eye last night.  He denies any foreign body sensation.  There is no evidence of hordeolum internum or externum on eyelid examination.  Patient's exam is consistent with conjunctivitis and given the mucopurulent discharge I suspect it is bacterial.  Patient's visual acuity OU 20/20, OD 20/20, OS 20/40.  I will start him on Vigamox 3 times daily for treatment of bacterial conjunctivitis and have advised him on home infection control precautions.  I have also advised him that if he does not have  any improvement of his symptoms in the next 48 hours and eyedrops, or if his symptoms worsen, he needs to follow-up with his ophthalmologist.   Final Clinical Impressions(s) / UC Diagnoses   Final diagnoses:  Acute bacterial  conjunctivitis of left eye     Discharge Instructions      Instill 1 drop of Vigamox in each eye every 8 hours for the next 7 days for treatment of your conjunctivitis.  Avoid touching your eyes as much as possible.  Wipe down all surfaces, countertops, and doorknobs after the first and second 24 hours on eyedrops.  Wash her face with a clean wash rag to remove any drainage and use a different portion of the wash rag to clean each eye so as to not reinfect yourself.  Return for reevaluation for any new or worsening symptoms.      ED Prescriptions     Medication Sig Dispense Auth. Provider   moxifloxacin (VIGAMOX) 0.5 % ophthalmic solution Place 1 drop into both eyes 3 (three) times daily for 7 days. 3 mL Bernardino Ditch, NP      PDMP not reviewed this encounter.   Bernardino Ditch, NP 09/16/23 [phone]
